# Patient Record
Sex: Male | Born: 1990 | Race: Black or African American | Hispanic: No | Marital: Single | Smoking: Current every day smoker
Health system: Southern US, Community
[De-identification: ages and names within clinical notes are randomized; demographics above are authoritative.]

---
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
## 2011-12-08 ENCOUNTER — Emergency Department: Payer: Self-pay | Admitting: Emergency Medicine

## 2013-04-06 ENCOUNTER — Emergency Department: Payer: Self-pay | Admitting: Emergency Medicine

## 2013-04-08 ENCOUNTER — Emergency Department: Payer: Self-pay | Admitting: Emergency Medicine

## 2013-11-09 ENCOUNTER — Emergency Department: Payer: Self-pay | Admitting: Internal Medicine

## 2013-12-02 ENCOUNTER — Emergency Department: Payer: Self-pay | Admitting: Emergency Medicine

## 2014-01-10 ENCOUNTER — Emergency Department: Payer: Self-pay | Admitting: Internal Medicine

## 2014-08-19 ENCOUNTER — Emergency Department: Payer: Self-pay | Admitting: Emergency Medicine

## 2014-08-22 LAB — BETA STREP CULTURE(ARMC)

## 2015-06-10 ENCOUNTER — Emergency Department
Admission: EM | Admit: 2015-06-10 | Discharge: 2015-06-10 | Disposition: A | Discharge status: Home / Self Care | Payer: Self-pay | Attending: Emergency Medicine | Admitting: Emergency Medicine

## 2015-06-10 ENCOUNTER — Encounter: Payer: Self-pay | Admitting: Emergency Medicine

## 2015-06-10 ENCOUNTER — Emergency Department: Payer: Self-pay

## 2015-06-10 DIAGNOSIS — F17200 Nicotine dependence, unspecified, uncomplicated: Secondary | ICD-10-CM | POA: Insufficient documentation

## 2015-06-10 DIAGNOSIS — Y998 Other external cause status: Secondary | ICD-10-CM | POA: Insufficient documentation

## 2015-06-10 DIAGNOSIS — Y9361 Activity, american tackle football: Secondary | ICD-10-CM | POA: Insufficient documentation

## 2015-06-10 DIAGNOSIS — Y92321 Football field as the place of occurrence of the external cause: Secondary | ICD-10-CM | POA: Insufficient documentation

## 2015-06-10 DIAGNOSIS — W03XXXA Other fall on same level due to collision with another person, initial encounter: Secondary | ICD-10-CM | POA: Insufficient documentation

## 2015-06-10 DIAGNOSIS — S40011A Contusion of right shoulder, initial encounter: Secondary | ICD-10-CM | POA: Insufficient documentation

## 2015-06-10 MED ORDER — IBUPROFEN 800 MG PO TABS: 800.0000 mg | ORAL_TABLET | Freq: Three times a day (TID) | ORAL | Status: DC

## 2015-06-10 NOTE — ED Notes (Signed)
Pt to ed with c/o right shoulder pain after playing football on Tuesday.

## 2015-06-10 NOTE — ED Notes (Signed)
States he was playing football on Tuesday. Was tackled and landed on right shoulder  . conts to have pain  No deformity noted

## 2015-06-10 NOTE — ED Provider Notes (Signed)
[facility] Emergency Department Provider Note  ____________________________________________  Time seen: Approximately 9:06 AM  I have reviewed the triage vital signs and the nursing notes.   HISTORY  Chief Complaint Shoulder Pain   HPI Antonio GottronDerrick M Ortiz is a 24 y.o. male is here complaining of right shoulder pain. Patient states he was playing football 2 days ago when he injured his shoulder. He denies any previous problems with his shoulder. Shoulder is more stiff in the mornings and improves a little during the day. He has not taken any medication today but took Tylenol last evening with minimal relief. Range of motion increases his pain. Currently he rates his pain is 7 out of 10. He denies any head injury or loss of consciousness during this event.   History reviewed. No pertinent past medical history.  There are no active problems to display for this patient.   History reviewed. No pertinent past surgical history.  Current Outpatient Rx  Name  Route  Sig  Dispense  Refill  . ibuprofen (ADVIL,MOTRIN) 800 MG tablet   Oral   Take 1 tablet (800 mg total) by mouth 3 (three) times daily.   30 tablet   0     Allergies Review of patient's allergies indicates no known allergies.  History reviewed. No pertinent family history.  Social History Social History  Substance Use Topics  . Smoking status: Current Every Day Smoker  . Smokeless tobacco: None  . Alcohol Use: Yes    Review of Systems Constitutional: No fever/chills Eyes: No visual changes. ENT: No sore throat. Cardiovascular: Denies chest pain. Respiratory: Denies shortness of breath. Gastrointestinal:  No nausea, no vomiting.   Musculoskeletal: Negative for back pain. Positive right shoulder pain Skin: Negative for rash. Neurological: Negative for headaches, focal weakness or numbness.  10-point ROS otherwise negative.  ____________________________________________   PHYSICAL  EXAM:  VITAL SIGNS: ED Triage Vitals  Enc Vitals Group     BP 06/10/15 0838 128/83 mmHg     Pulse Rate 06/10/15 0838 61     Resp 06/10/15 0838 20     Temp 06/10/15 0838 98.2 F (36.8 C)     Temp Source 06/10/15 0838 Oral     SpO2 06/10/15 0838 97 %     Weight 06/10/15 0838 186 lb (84.369 kg)     Height 06/10/15 0838 5\' 6"  (1.676 m)     Head Cir --      Peak Flow --      Pain Score 06/10/15 0839 7     Pain Loc --      Pain Edu? --      Excl. in GC? --     Constitutional: Alert and oriented. Well appearing and in no acute distress. Eyes: Conjunctivae are normal. PERRL. EOMI. Head: Atraumatic. Nose: No congestion/rhinnorhea. Neck: No stridor.   Cardiovascular: Normal rate, regular rhythm. Grossly normal heart sounds.  Good peripheral circulation. Respiratory: Normal respiratory effort.  No retractions. Lungs CTAB. Gastrointestinal: Soft and nontender. No distention.  Musculoskeletal: No gross deformity noted on examination of the right shoulder. Range of motion is restricted secondary to pain especially with abduction. No edema or crepitus was noted. No lower extremity tenderness nor edema.  No joint effusions. Neurologic:  Normal speech and language. No gross focal neurologic deficits are appreciated. No gait instability. Skin:  Skin is warm, dry and intact. No rash noted. No abrasions, ecchymosis or erythema was noted on the right shoulder. Psychiatric: Mood and affect are normal. Speech  and behavior are normal.  ____________________________________________   LABS (all labs ordered are listed, but only abnormal results are displayed)  Labs Reviewed - No data to display RADIOLOGY  Right shoulder x-ray failed to show any acute fracture or dislocation per radiologist I, Tommi Rumps, personally viewed and evaluated these images (plain radiographs) as part of my medical decision making.  ____________________________________________   PROCEDURES  Procedure(s)  performed: None  Critical Care performed: No  ____________________________________________   INITIAL IMPRESSION / ASSESSMENT AND PLAN / ED COURSE  Pertinent labs & imaging results that were available during my care of the patient were reviewed by me and considered in my medical decision making (see chart for details).  Patient was placed in a arm sling and given a prescription for ibuprofen 800 mg 3 times a day. He is to use ice and elevate as needed. He may follow-up with Dr. Martha Clan if any continued problems. ____________________________________________   FINAL CLINICAL IMPRESSION(S) / ED DIAGNOSES  Final diagnoses:  Shoulder contusion, right, initial encounter      Tommi Rumps, PA-C 06/10/15 1402  Arnaldo Natal, MD 06/10/15 [phone]

## 2015-06-10 NOTE — Discharge Instructions (Signed)
Contusion A contusion is a deep bruise. Contusions happen when an injury causes bleeding under the skin. Symptoms of bruising include pain, swelling, and discolored skin. The skin may turn blue, purple, or yellow. HOME CARE   Rest the injured area.  If told, put ice on the injured area.  Put ice in a plastic bag.  Place a towel between your skin and the bag.  Leave the ice on for 20 minutes, 2-3 times per day.  If told, put light pressure (compression) on the injured area using an elastic bandage. Make sure the bandage is not too tight. Remove it and put it back on as told by your doctor.  If possible, raise (elevate) the injured area above the level of your heart while you are sitting or lying down.  Take over-the-counter and prescription medicines only as told by your doctor. GET HELP IF:  Your symptoms do not get better after several days of treatment.  Your symptoms get worse.  You have trouble moving the injured area. GET HELP RIGHT AWAY IF:   You have very bad pain.  You have a loss of feeling (numbness) in a hand or foot.  Your hand or foot turns pale or cold.   This information is not intended to replace advice given to you by your health care provider. Make sure you discuss any questions you have with your health care provider.   Document Released: 01/17/2008 Document Revised: 04/21/2015 Document Reviewed: 12/16/2014 Elsevier Interactive Patient Education 2016 Elsevier Inc.  Cryotherapy Cryotherapy is when you put ice on your injury. Ice helps lessen pain and puffiness (swelling) after an injury. Ice works the best when you start using it in the first 24 to 48 hours after an injury. HOME CARE  Put a dry or damp towel between the ice pack and your skin.  You may press gently on the ice pack.  Leave the ice on for no more than 10 to 20 minutes at a time.  Check your skin after 5 minutes to make sure your skin is okay.  Rest at least 20 minutes between ice  pack uses.  Stop using ice when your skin loses feeling (numbness).  Do not use ice on someone who cannot tell you when it hurts. This includes small children and people with memory problems (dementia). GET HELP RIGHT AWAY IF:  You have white spots on your skin.  Your skin turns blue or pale.  Your skin feels waxy or hard.  Your puffiness gets worse. MAKE SURE YOU:   Understand these instructions.  Will watch your condition.  Will get help right away if you are not doing well or get worse.   This information is not intended to replace advice given to you by your health care provider. Make sure you discuss any questions you have with your health care provider.   Document Released: 01/17/2008 Document Revised: 10/23/2011 Document Reviewed: 03/23/2011 Elsevier Interactive Patient Education 2016 [employer]    [facility] with your doctor or [facility] any continued problems. Wear sling as needed for support. Use ice frequently today. 8 ibuprofen every 8 hours as needed for pain and inflammation.

## 2015-10-22 ENCOUNTER — Emergency Department
Admission: EM | Admit: 2015-10-22 | Discharge: 2015-10-22 | Disposition: A | Discharge status: Home / Self Care | Payer: Self-pay | Attending: Emergency Medicine | Admitting: Emergency Medicine

## 2015-10-22 ENCOUNTER — Encounter: Payer: Self-pay | Admitting: Medical Oncology

## 2015-10-22 ENCOUNTER — Emergency Department: Payer: Self-pay

## 2015-10-22 DIAGNOSIS — S60222A Contusion of left hand, initial encounter: Secondary | ICD-10-CM

## 2015-10-22 DIAGNOSIS — Y999 Unspecified external cause status: Secondary | ICD-10-CM | POA: Insufficient documentation

## 2015-10-22 DIAGNOSIS — F17210 Nicotine dependence, cigarettes, uncomplicated: Secondary | ICD-10-CM | POA: Insufficient documentation

## 2015-10-22 DIAGNOSIS — W51XXXA Accidental striking against or bumped into by another person, initial encounter: Secondary | ICD-10-CM | POA: Insufficient documentation

## 2015-10-22 DIAGNOSIS — S63502A Unspecified sprain of left wrist, initial encounter: Secondary | ICD-10-CM | POA: Insufficient documentation

## 2015-10-22 DIAGNOSIS — Y9361 Activity, american tackle football: Secondary | ICD-10-CM | POA: Insufficient documentation

## 2015-10-22 DIAGNOSIS — Y929 Unspecified place or not applicable: Secondary | ICD-10-CM | POA: Insufficient documentation

## 2015-10-22 MED ORDER — MELOXICAM 15 MG PO TABS: 15.0000 mg | ORAL_TABLET | Freq: Every day | ORAL | Status: DC

## 2015-10-22 NOTE — ED Notes (Signed)
Pt reports he was playing foot ball yesterday and injured his left hand.

## 2015-10-22 NOTE — ED Provider Notes (Signed)
[facility] Emergency Department Provider Note  ____________________________________________  Time seen: Approximately 3:50 PM  I have reviewed the triage vital signs and the nursing notes.   HISTORY  Chief Complaint Hand Injury    HPI Antonio Ortiz is a 25 y.o. male who injured his left hand playing football yesterday. Patient states that he went to tackle somebody and landed with his hand underneath the other person. He states that he is having pain to the lateral aspect of his hand. He does endorse a previous fracture to the fifth metacarpal bone but states that it has been "a really long time ago." Denies any numbness or tingling in his distal digits. He denies any other injury or complaint at this time. Pain is constant, moderate to severe, sharp in nature.   History reviewed. No pertinent past medical history.  There are no active problems to display for this patient.   History reviewed. No pertinent past surgical history.  Current Outpatient Rx  Name  Route  Sig  Dispense  Refill  . ibuprofen (ADVIL,MOTRIN) 800 MG tablet   Oral   Take 1 tablet (800 mg total) by mouth 3 (three) times daily.   30 tablet   0   . meloxicam (MOBIC) 15 MG tablet   Oral   Take 1 tablet (15 mg total) by mouth daily.   30 tablet   0     Allergies Review of patient's allergies indicates no known allergies.  No family history on file.  Social History Social History  Substance Use Topics  . Smoking status: Current Every Day Smoker  . Smokeless tobacco: None  . Alcohol Use: Yes     Review of Systems  Constitutional: No fever/chills Musculoskeletal: Negative for back pain.Left hand pain.  Skin: Negative for rash. Neurological: Negative for headaches, focal weakness or numbness. 10-point ROS otherwise negative.  ____________________________________________   PHYSICAL EXAM:  VITAL SIGNS: ED Triage Vitals  Enc Vitals Group     BP 10/22/15 1437  137/71 mmHg     Pulse Rate 10/22/15 1437 74     Resp 10/22/15 1437 16     Temp 10/22/15 1437 97.9 F (36.6 C)     Temp Source 10/22/15 1437 Oral     SpO2 10/22/15 1437 98 %     Weight 10/22/15 1437 190 lb (86.183 kg)     Height 10/22/15 1437 5\' 6"  (1.676 m)     Head Cir --      Peak Flow --      Pain Score 10/22/15 1437 7     Pain Loc --      Pain Edu? --      Excl. in GC? --      Constitutional: Alert and oriented. Well appearing and in no acute distress. Eyes: Conjunctivae are normal. PERRL. EOMI. Head: Atraumatic. Cardiovascular: Normal rate, regular rhythm. Normal S1 and S2.  Good peripheral circulation. Respiratory: Normal respiratory effort without tachypnea or retractions. Lungs CTAB. [place]: Minor edema noted to lateral left hand. FROM to wrist and all digits. TTP over 5th metacarpal. No palpable abnormality. Cap refill and sensation intact distally to injury site Neurologic:  Normal speech and language. No gross focal neurologic deficits are appreciated.  Skin:  Skin is warm, dry and intact. No rash noted. Psychiatric: Mood and affect are normal. Speech and behavior are normal. Patient exhibits appropriate insight and judgement.   ____________________________________________   LABS (all labs ordered are listed, but only abnormal results are displayed)  Labs Reviewed - No data to display ____________________________________________  EKG   ____________________________________________  RADIOLOGY Festus Barren Cuthriell, personally viewed and evaluated these images (plain radiographs) as part of my medical decision making, as well as reviewing the written report by the radiologist.  Dg Hand Complete Left  10/22/2015  CLINICAL DATA:  Pt playing football yesterday and hurt his left hand, complains of pain 5th metatarsal EXAM: LEFT HAND - COMPLETE 3+ VIEW COMPARISON:  01/10/2014 FINDINGS: Mild deformity fifth metacarpal related to prior midshaft fracture which  is no longer visible. No other abnormalities. IMPRESSION: Healed fifth metacarpal fracture.  No acute findings. Electronically Signed   By: Esperanza Heir M.D.   On: 10/22/2015 15:25    ____________________________________________    PROCEDURES  Procedure(s) performed:       Medications - No data to display   ____________________________________________   INITIAL IMPRESSION / ASSESSMENT AND PLAN / ED COURSE  Pertinent labs & imaging results that were available during my care of the patient were reviewed by me and considered in my medical decision making (see chart for details).  Patient's diagnosis is consistent with Left hand contusion and wrist sprain.  Patient will be sent home with prescriptions for  anti-inflammatories. Patient is to follow up with orthopedics if symptoms persist past this treatment course. Patient is given ED precautions to return to the ED for any worsening or new symptoms.     ____________________________________________  FINAL CLINICAL IMPRESSION(S) / ED DIAGNOSES  Final diagnoses:  Hand contusion, left, initial encounter  Left wrist sprain, initial encounter      NEW MEDICATIONS STARTED DURING THIS VISIT:  New Prescriptions   MELOXICAM (MOBIC) 15 MG TABLET    Take 1 tablet (15 mg total) by mouth daily.        This chart was dictated using voice recognition software/Dragon. Despite best efforts to proofread, errors can occur which can change the meaning. Any change was purely unintentional.    Racheal Patches, PA-C 10/22/15 1610  Phineas Semen, MD 10/22/15 [phone]

## 2015-10-22 NOTE — Discharge Instructions (Signed)
Hand Contusion A hand contusion is a deep bruise on your hand area. Contusions are the result of an injury that caused bleeding under the skin. The contusion may turn blue, purple, or yellow. Minor injuries will give you a painless contusion, but more severe contusions may stay painful and swollen for a few weeks. CAUSES  A contusion is usually caused by a blow, trauma, or direct force to an area of the body. SYMPTOMS   Swelling and redness of the injured area.  Discoloration of the injured area.  Tenderness and soreness of the injured area.  Pain. DIAGNOSIS  The diagnosis can be made by taking a history and performing a physical exam. An X-ray, CT scan, or MRI may be needed to determine if there were any associated injuries, such as broken bones (fractures). TREATMENT  Often, the best treatment for a hand contusion is resting, elevating, icing, and applying cold compresses to the injured area. Over-the-counter medicines may also be recommended for pain control. HOME CARE INSTRUCTIONS   Put ice on the injured area.  Put ice in a plastic bag.  Place a towel between your skin and the bag.  Leave the ice on for 15-20 minutes, 03-04 times a day.  Only take over-the-counter or prescription medicines as directed by your caregiver. Your caregiver may recommend avoiding anti-inflammatory medicines (aspirin, ibuprofen, and naproxen) for 48 hours because these medicines may increase bruising.  If told, use an elastic wrap as directed. This can help reduce swelling. You may remove the wrap for sleeping, showering, and bathing. If your fingers become numb, cold, or blue, take the wrap off and reapply it more loosely.  Elevate your hand with pillows to reduce swelling.  Avoid overusing your hand if it is painful. SEEK IMMEDIATE MEDICAL CARE IF:   You have increased redness, swelling, or pain in your hand.  Your swelling or pain is not relieved with medicines.  You have loss of feeling in  your hand or are unable to move your fingers.  Your hand turns cold or blue.  You have pain when you move your fingers.  Your hand becomes warm to the touch.  Your contusion does not improve in 2 days. MAKE SURE YOU:   Understand these instructions.  Will watch your condition.  Will get help right away if you are not doing well or get worse.   This information is not intended to replace advice given to you by your health care provider. Make sure you discuss any questions you have with your health care provider.   Document Released: 01/20/2002 Document Revised: 04/24/2012 Document Reviewed: 01/22/2012 Elsevier Interactive Patient Education 2016 Elsevier Inc.  Wrist Sprain A wrist sprain is a stretch or tear in the strong, fibrous tissues (ligaments) that connect your wrist bones. The ligaments of your wrist may be easily sprained. There are three types of wrist sprains.  Grade 1. The ligament is not stretched or torn, but the sprain causes pain.  Grade 2. The ligament is stretched or partially torn. You may be able to move your wrist, but not very much.  Grade 3. The ligament or muscle completely tears. You may find it difficult or extremely painful to move your wrist even a little. CAUSES Often, wrist sprains are a result of a fall or an injury. The force of the impact causes the fibers of your ligament to stretch too much or tear. Common causes of wrist sprains include:  Overextending your wrist while catching a ball with your  hands.  Repetitive or strenuous extension or bending of your wrist.  Landing on your hand during a fall. RISK FACTORS  Having previous wrist injuries.  Playing contact sports, such as boxing or wrestling.  Participating in activities in which falling is common.  Having poor wrist strength and flexibility. SIGNS AND SYMPTOMS  Wrist pain.  Wrist tenderness.  Inflammation or bruising of the wrist area.  Hearing a "pop" or feeling a tear at  the time of the injury.  Decreased wrist movement due to pain, stiffness, or weakness. DIAGNOSIS Your health care provider will examine your wrist. In some cases, an X-ray will be taken to make sure you did not break any bones. If your health care provider thinks that you tore a ligament, he or she may order an MRI of your wrist. TREATMENT Treatment involves resting and icing your wrist. You may also need to take pain medicines to help lessen pain and inflammation. Your health care provider may recommend keeping your wrist still (immobilized) with a splint to help your sprain heal. When the splint is no longer necessary, you may need to perform strengthening and stretching exercises. These exercises help you to regain strength and full range of motion in your wrist. Surgery is not usually needed for wrist sprains unless the ligament completely tears. HOME CARE INSTRUCTIONS  Rest your wrist. Do not do things that cause pain.  Wear your wrist splint as directed by your health care provider.  Take medicines only as directed by your health care provider.  To ease pain and swelling, apply ice to the injured area.  Put ice in a plastic bag.  Place a towel between your skin and the bag.  Leave the ice on for 20 minutes, 2-3 times a day. SEEK MEDICAL CARE IF:  Your pain, discomfort, or swelling gets worse even with treatment.  You feel sudden numbness in your hand.   This information is not intended to replace advice given to you by your health care provider. Make sure you discuss any questions you have with your health care provider.   Document Released: 04/03/2014 Document Reviewed: 04/03/2014 Elsevier Interactive Patient Education [employer] Elsevier Inc.

## 2016-05-13 ENCOUNTER — Emergency Department: Payer: Self-pay

## 2016-05-13 ENCOUNTER — Encounter: Payer: Self-pay | Admitting: Emergency Medicine

## 2016-05-13 ENCOUNTER — Emergency Department
Admission: EM | Admit: 2016-05-13 | Discharge: 2016-05-13 | Disposition: A | Discharge status: Home / Self Care | Payer: Self-pay | Attending: Student in an Organized Health Care Education/Training Program | Admitting: Student in an Organized Health Care Education/Training Program

## 2016-05-13 DIAGNOSIS — Y9389 Activity, other specified: Secondary | ICD-10-CM | POA: Insufficient documentation

## 2016-05-13 DIAGNOSIS — F17210 Nicotine dependence, cigarettes, uncomplicated: Secondary | ICD-10-CM | POA: Insufficient documentation

## 2016-05-13 DIAGNOSIS — Y990 Civilian activity done for income or pay: Secondary | ICD-10-CM | POA: Insufficient documentation

## 2016-05-13 DIAGNOSIS — S233XXA Sprain of ligaments of thoracic spine, initial encounter: Secondary | ICD-10-CM | POA: Insufficient documentation

## 2016-05-13 DIAGNOSIS — Y929 Unspecified place or not applicable: Secondary | ICD-10-CM | POA: Insufficient documentation

## 2016-05-13 DIAGNOSIS — S239XXA Sprain of unspecified parts of thorax, initial encounter: Secondary | ICD-10-CM

## 2016-05-13 DIAGNOSIS — R0789 Other chest pain: Secondary | ICD-10-CM | POA: Insufficient documentation

## 2016-05-13 DIAGNOSIS — X500XXA Overexertion from strenuous movement or load, initial encounter: Secondary | ICD-10-CM | POA: Insufficient documentation

## 2016-05-13 DIAGNOSIS — Y92009 Unspecified place in unspecified non-institutional (private) residence as the place of occurrence of the external cause: Secondary | ICD-10-CM | POA: Insufficient documentation

## 2016-05-13 MED ORDER — CYCLOBENZAPRINE HCL 10 MG PO TABS
5.0000 mg | ORAL_TABLET | Freq: Once | ORAL | Status: AC
  Administered 2016-05-13: 5 mg via ORAL
  Filled 2016-05-13: qty 1

## 2016-05-13 MED ORDER — DEXAMETHASONE SODIUM PHOSPHATE 10 MG/ML IJ SOLN
10.0000 mg | Freq: Once | INTRAMUSCULAR | Status: AC
  Administered 2016-05-13: 10 mg via INTRAMUSCULAR
  Filled 2016-05-13: qty 1

## 2016-05-13 MED ORDER — PREDNISONE 10 MG PO TABS: 10.0000 mg | ORAL_TABLET | Freq: Two times a day (BID) | ORAL | 0 refills | Status: DC

## 2016-05-13 MED ORDER — CYCLOBENZAPRINE HCL 5 MG PO TABS: 5.0000 mg | ORAL_TABLET | Freq: Three times a day (TID) | ORAL | 0 refills | Status: DC | PRN

## 2016-05-13 NOTE — ED Notes (Signed)
States he developed pain to mid chest 2 days ago.  Pain increased with inspiration. denies any fever cough or n/v .

## 2016-05-13 NOTE — ED Triage Notes (Signed)
Pt arrived via POV from home with c/o chest wall pain on inspiration. Pt states he was unable to sleep last night due to difficulty breathing. Pt also states he has been having back pain for the past 3 days worse on the left side. Pt states he does do some heavy lifting at work and pain feels better upon palpation.

## 2016-05-13 NOTE — ED Provider Notes (Signed)
[facility] Emergency Department Provider Note ____________________________________________  Time seen: 12  I have reviewed the triage vital signs and the nursing notes.  HISTORY  Chief Complaint  Back Pain and Chest Pain (Chest Wall Pain)  HPI Antonio Ortiz is a 25 y.o. male presents to the ED from home via personal vehicle with complaints of anterior left chest wall pain on inspiration. He reports the onset of the pain at rest. He describes pain as been significant enough to impede his sleep last night. He also describes a 3 day complaint of upper back pain on the left side. He does not report a connection of the back and chest wall pain. He can reproduce the anterior chest wall pain by touching his left pectoralis muscle. Patient is a current every day smoker denies any injury, accident, trauma, fall. He also denies any dyspnea on exertion, diaphoresis, nausea, vomiting, or referral of pain.  History reviewed. No pertinent past medical history.  There are no active problems to display for this patient.  History reviewed. No pertinent surgical history.  Prior to Admission medications   Medication Sig Start Date End Date Taking? Authorizing Provider  cyclobenzaprine (FLEXERIL) 5 MG tablet Take 1 tablet (5 mg total) by mouth 3 (three) times daily as needed for muscle spasms. 05/13/16   Eiden Bagot V Bacon Rendy Lazard, PA-C  predniSONE (DELTASONE) 10 MG tablet Take 1 tablet (10 mg total) by mouth 2 (two) times daily with a meal. 05/13/16   Cartha Rotert V Bacon Raul Torrance, PA-C    Allergies Review of patient's allergies indicates no known allergies.  History reviewed. No pertinent family history.  Social History Social History  Substance Use Topics  . Smoking status: Current Every Day Smoker    Packs/day: 0.50    Types: Cigarettes  . Smokeless tobacco: Never Used  . Alcohol use Yes   Review of Systems  Constitutional: Negative for fever. Cardiovascular: Negative for  chest pain. Respiratory: Negative for shortness of breath. Gastrointestinal: Negative for abdominal pain, vomiting and diarrhea. Genitourinary: Negative for dysuria. Musculoskeletal: Positive for chest wall and back pain. Skin: Negative for rash. Neurological: Negative for headaches, focal weakness or numbness. ____________________________________________  PHYSICAL EXAM:  VITAL SIGNS: ED Triage Vitals  Enc Vitals Group     BP 05/13/16 1041 (!) 144/88     Pulse Rate 05/13/16 1041 68     Resp 05/13/16 1041 18     Temp 05/13/16 1041 98.2 F (36.8 C)     Temp src --      SpO2 05/13/16 1041 99 %     Weight 05/13/16 1035 203 lb (92.1 kg)     Height 05/13/16 1035 5' 6.5" (1.689 m)     Head Circumference --      Peak Flow --      Pain Score 05/13/16 1036 9     Pain Loc --      Pain Edu? --      Excl. in GC? --     Constitutional: Alert and oriented. Well appearing and in no distress. Head: Normocephalic and atraumatic.      Eyes: Conjunctivae are normal. PERRL. Normal extraocular movements      Ears: Canals clear. TMs intact bilaterally.   Nose: No congestion/rhinorrhea.   Mouth/Throat: Mucous membranes are moist.   Neck: Supple. No thyromegaly. Cardiovascular: Normal rate, regular rhythm.  Respiratory: Normal respiratory effort. No wheezes/rales/rhonchi. Gastrointestinal: Soft and nontender. No distention. Musculoskeletal: Nontender with normal range of motion in all extremities.  Neurologic:  Normal gait without ataxia. Normal speech and language. No gross focal neurologic deficits are appreciated. Skin:  Skin is warm, dry and intact. No rash noted. Psychiatric: Mood and affect are normal. Patient exhibits appropriate insight and judgment. ____________________________________________  EKG  NSR 67 bpm Normal axis No STEMI ____________________________________________   RADIOLOGY  CXR IMPRESSION: No active cardiopulmonary  disease. ____________________________________________  PROCEDURES  Decadron 10 mg IM Flexeril 5 mg PO ____________________________________________  INITIAL IMPRESSION / ASSESSMENT AND PLAN / ED COURSE  Patient with musculoskeletal anterior chest wall pain and left-sided scapular thoracic muscle pain. His exam is reassuring as his pain is reproducible with palpation. He also has a negative chest x-ray and normal EKG at this time. Patient is discharged with a prescription for prednisone and Flexeril dose as directed. He will follow verbal commands advised the management. Return precautions are reviewed. A work note is provided as requested for 3 days.  Clinical Course   ____________________________________________  FINAL CLINICAL IMPRESSION(S) / ED DIAGNOSES  Final diagnoses:  Chest wall pain  Thoracic back sprain, initial encounter      Lissa HoardJenise V Bacon Daymien Goth, PA-C 05/13/16 1209    Willy EddyPatrick Robinson, MD 05/13/16 [id number]

## 2016-05-13 NOTE — Discharge Instructions (Signed)
Your EKG, chest x-ray and exam are normal. There is no indication of infection, pneumonia, or bronchitis. You are being treated for musculoskeletal pain likely due to inflammation. Take the prescription meds as directed. Apply moist heat to reduce symptoms. [facility] for continued symptoms.

## 2016-09-02 ENCOUNTER — Emergency Department
Admission: EM | Admit: 2016-09-02 | Discharge: 2016-09-02 | Disposition: A | Discharge status: Home / Self Care | Payer: Self-pay | Attending: Emergency Medicine | Admitting: Emergency Medicine

## 2016-09-02 ENCOUNTER — Encounter: Payer: Self-pay | Admitting: Emergency Medicine

## 2016-09-02 DIAGNOSIS — Y999 Unspecified external cause status: Secondary | ICD-10-CM | POA: Insufficient documentation

## 2016-09-02 DIAGNOSIS — Y9389 Activity, other specified: Secondary | ICD-10-CM | POA: Insufficient documentation

## 2016-09-02 DIAGNOSIS — X501XXA Overexertion from prolonged static or awkward postures, initial encounter: Secondary | ICD-10-CM | POA: Insufficient documentation

## 2016-09-02 DIAGNOSIS — Y929 Unspecified place or not applicable: Secondary | ICD-10-CM | POA: Insufficient documentation

## 2016-09-02 DIAGNOSIS — S161XXA Strain of muscle, fascia and tendon at neck level, initial encounter: Secondary | ICD-10-CM | POA: Insufficient documentation

## 2016-09-02 DIAGNOSIS — Z79899 Other long term (current) drug therapy: Secondary | ICD-10-CM | POA: Insufficient documentation

## 2016-09-02 DIAGNOSIS — F17210 Nicotine dependence, cigarettes, uncomplicated: Secondary | ICD-10-CM | POA: Insufficient documentation

## 2016-09-02 DIAGNOSIS — R300 Dysuria: Secondary | ICD-10-CM | POA: Insufficient documentation

## 2016-09-02 LAB — URINALYSIS, ROUTINE W REFLEX MICROSCOPIC
BILIRUBIN URINE: NEGATIVE
Glucose, UA: NEGATIVE mg/dL
HGB URINE DIPSTICK: NEGATIVE
Ketones, ur: NEGATIVE mg/dL
Leukocytes, UA: NEGATIVE
Nitrite: NEGATIVE
Protein, ur: NEGATIVE mg/dL
SPECIFIC GRAVITY, URINE: 1.028 (ref 1.005–1.030)
pH: 5 (ref 5.0–8.0)

## 2016-09-02 LAB — CHLAMYDIA/NGC RT PCR (ARMC ONLY)
Chlamydia Tr: NOT DETECTED
N gonorrhoeae: NOT DETECTED

## 2016-09-02 MED ORDER — IBUPROFEN 800 MG PO TABS: 800.0000 mg | ORAL_TABLET | Freq: Three times a day (TID) | ORAL | 0 refills | Status: DC

## 2016-09-02 MED ORDER — IBUPROFEN 800 MG PO TABS
800.0000 mg | ORAL_TABLET | Freq: Once | ORAL | Status: AC
  Administered 2016-09-02: 800 mg via ORAL
  Filled 2016-09-02: qty 1

## 2016-09-02 MED ORDER — METHOCARBAMOL 500 MG PO TABS: 500.0000 mg | ORAL_TABLET | Freq: Four times a day (QID) | ORAL | 0 refills | Status: DC

## 2016-09-02 NOTE — ED Provider Notes (Signed)
[facility] Emergency Department Provider Note  ____________________________________________   First MD Initiated Contact with Patient 09/02/16 1044     (approximate)  I have reviewed the triage vital signs and the nursing notes.   HISTORY  Chief Complaint Torticollis and Dysuria    HPI Antonio Ortiz is a 26 y.o. male is here with complaint of neck pain.Patient denies any history of injury. He states that he overused his neck at work and states that he needs a work note. He states that this morning he continues to have "stiffness". He also does not want to file a workman's comp. While he was here he mentions that he has had some urinary difficulty. Past year. Patient states that for the last 3 months he has had some itching, hesitancy, burning sensation and occasionally some discharge. He denies any fever or chills. There's been no nausea or vomiting. He has not been seen for this prior to today's visit. He denies any prior urinary tract infections.   History reviewed. No pertinent past medical history.  There are no active problems to display for this patient.   History reviewed. No pertinent surgical history.  Prior to Admission medications   Medication Sig Start Date End Date Taking? Authorizing Provider  cyclobenzaprine (FLEXERIL) 5 MG tablet Take 1 tablet (5 mg total) by mouth 3 (three) times daily as needed for muscle spasms. 05/13/16   Jenise V Bacon Menshew, PA-C  ibuprofen (ADVIL,MOTRIN) 800 MG tablet Take 1 tablet (800 mg total) by mouth 3 (three) times daily. 09/02/16   Tommi Rumpshonda L Summers, PA-C  methocarbamol (ROBAXIN) 500 MG tablet Take 1 tablet (500 mg total) by mouth 4 (four) times daily. 09/02/16   Tommi Rumpshonda L Summers, PA-C  predniSONE (DELTASONE) 10 MG tablet Take 1 tablet (10 mg total) by mouth 2 (two) times daily with a meal. 05/13/16   Jenise V Bacon Menshew, PA-C    Allergies Patient has no known allergies.  No family history on  file.  Social History Social History  Substance Use Topics  . Smoking status: Current Every Day Smoker    Packs/day: 0.50    Types: Cigarettes  . Smokeless tobacco: Never Used  . Alcohol use Yes    Review of Systems Constitutional: No fever/chills Eyes: No visual changes. ENT: No sore throat. Denies ear pain. Cardiovascular: Denies chest pain. Respiratory: Denies shortness of breath. Gastrointestinal: No abdominal pain.  No nausea, no vomiting.   Genitourinary: Positive for chronic dysuria. Musculoskeletal: Negative for back pain. Skin: Negative for rash. Neurological: Negative for headaches, focal weakness or numbness.  10-point ROS otherwise negative.  ____________________________________________   PHYSICAL EXAM:  VITAL SIGNS: ED Triage Vitals  Enc Vitals Group     BP 09/02/16 0941 134/66     Pulse Rate 09/02/16 0941 66     Resp 09/02/16 0941 18     Temp 09/02/16 0941 98.1 F (36.7 C)     Temp src --      SpO2 09/02/16 0941 100 %     Weight 09/02/16 0942 187 lb (84.8 kg)     Height 09/02/16 0942 5\' 6"  (1.676 m)     Head Circumference --      Peak Flow --      Pain Score 09/02/16 0943 5     Pain Loc --      Pain Edu? --      Excl. in GC? --     Constitutional: Alert and oriented. Well appearing and in  no acute distress. Eyes: Conjunctivae are normal. PERRL. EOMI. Head: Atraumatic. Nose: No congestion/rhinnorhea. Neck: No stridor.  Minimal tenderness on palpation of cervical spine posteriorly. No gross deformity. Range of motion is slightly restricted in all planes secondary to discomfort. There is tenderness on palpation of the cervical muscles lateral aspect. Hematological/Lymphatic/Immunilogical: No cervical lymphadenopathy. Cardiovascular: Normal rate, regular rhythm. Grossly normal heart sounds.  Good peripheral circulation. Respiratory: Normal respiratory effort.  No retractions. Lungs CTAB. Gastrointestinal: Soft and nontender. No distention.  No CVA  tenderness. Genitourinary: Patient refused a prostate exam. Musculoskeletal: Moves upper and lower extremities benign difficulty. Normal gait was noted. Neurologic:  Normal speech and language. No gross focal neurologic deficits are appreciated. No gait instability. Skin:  Skin is warm, dry and intact. No rash noted. Psychiatric: Mood and affect are normal. Speech and behavior are normal.  ____________________________________________   LABS (all labs ordered are listed, but only abnormal results are displayed)  Labs Reviewed  URINALYSIS, ROUTINE W REFLEX MICROSCOPIC - Abnormal; Notable for the following:       Result Value   Color, Urine YELLOW (*)    APPearance CLEAR (*)    All other components within normal limits  CHLAMYDIA/NGC RT PCR ([facility] ONLY)    PROCEDURES  Procedure(s) performed: None  Procedures  Critical Care performed: No  ____________________________________________   INITIAL IMPRESSION / ASSESSMENT AND PLAN / ED COURSE  Pertinent labs & imaging results that were available during my care of the patient were reviewed by me and considered in my medical decision making (see chart for details).  Patient was made aware that his urine was normal. Patient refused examination of his prostate stating that he would "mentally have to get ready". While in the emergency room patient was given ibuprofen and appeared to be moving his neck much better however when asked patient states that he does not see an improvement. Patient was discharged on ibuprofen and Robaxin 4 times a day. He is encouraged to use ice or heat to his neck for discomfort. He was also given the name of the urologist on call to follow-up for any continued neurological symptoms. Patient was given a note to return to work tomorrow.      ____________________________________________   FINAL CLINICAL IMPRESSION(S) / ED DIAGNOSES  Final diagnoses:  Cervical muscle strain, initial encounter    Dysuria      NEW MEDICATIONS STARTED DURING THIS VISIT:  Discharge Medication List as of 09/02/2016 12:45 PM    START taking these medications   Details  ibuprofen (ADVIL,MOTRIN) 800 MG tablet Take 1 tablet (800 mg total) by mouth 3 (three) times daily., Starting Sat 09/02/2016, Print    methocarbamol (ROBAXIN) 500 MG tablet Take 1 tablet (500 mg total) by mouth 4 (four) times daily., Starting Sat 09/02/2016, Print         Note:  This document was prepared using Dragon voice recognition software and may include unintentional dictation errors.    Tommi Rumps, PA-C 09/02/16 1315    Minna Antis, MD 09/02/16 [phone]

## 2016-09-02 NOTE — ED Notes (Signed)
Pt reports neck pain that started yesterday painful to move in any direction, Pt also reports urinary difficulty for the past year, when patient was asked privately about discharge, pt states he has had some penile discharge.  Pt given cups to collect for dirty and clean specimen.  Pt states when he urinates, he has hesitancy, tingling, burning and an itching sensation.

## 2016-09-02 NOTE — ED Triage Notes (Signed)
Reports neck is stiff from the way he has to hold it at work.  States he needs a work note.  [facility]

## 2016-09-02 NOTE — Discharge Instructions (Signed)
Call and make an appointment with Dr. Jerrel IvoryWollin for further testing of your urine problems.   Take ibuprofen for inflammation and pain, take Robaxin for muscles.  Moist heat or ice to neck muscles.

## 2018-04-30 ENCOUNTER — Encounter: Payer: Self-pay | Admitting: Emergency Medicine

## 2018-04-30 ENCOUNTER — Emergency Department
Admission: EM | Admit: 2018-04-30 | Discharge: 2018-04-30 | Disposition: A | Discharge status: Home / Self Care | Payer: Self-pay | Attending: Emergency Medicine | Admitting: Emergency Medicine

## 2018-04-30 DIAGNOSIS — F17210 Nicotine dependence, cigarettes, uncomplicated: Secondary | ICD-10-CM | POA: Insufficient documentation

## 2018-04-30 DIAGNOSIS — J020 Streptococcal pharyngitis: Secondary | ICD-10-CM | POA: Insufficient documentation

## 2018-04-30 LAB — GROUP A STREP BY PCR: Group A Strep by PCR: DETECTED — AB

## 2018-04-30 MED ORDER — AMOXICILLIN 500 MG PO CAPS: 500.0000 mg | ORAL_CAPSULE | Freq: Three times a day (TID) | ORAL | 0 refills | Status: DC

## 2018-04-30 MED ORDER — LIDOCAINE VISCOUS HCL 2 % MT SOLN
15.0000 mL | Freq: Once | OROMUCOSAL | Status: AC
  Administered 2018-04-30: 15 mL via OROMUCOSAL
  Filled 2018-04-30: qty 15

## 2018-04-30 MED ORDER — DIPHENHYDRAMINE HCL 12.5 MG/5ML PO ELIX
12.5000 mg | ORAL_SOLUTION | Freq: Once | ORAL | Status: AC
  Administered 2018-04-30: 12.5 mg via ORAL
  Filled 2018-04-30: qty 5

## 2018-04-30 MED ORDER — LIDOCAINE VISCOUS HCL 2 % MT SOLN: 5.0000 mL | Freq: Four times a day (QID) | OROMUCOSAL | 0 refills | Status: DC | PRN

## 2018-04-30 NOTE — ED Notes (Signed)
See triage note   Presents with sore throat for the past week   Unsure of fever  But having increased pain with swallowing

## 2018-04-30 NOTE — ED Provider Notes (Signed)
[facility] Emergency Department Provider Note   ____________________________________________   First MD Initiated Contact with Patient 04/30/18 1230     (approximate)  I have reviewed the triage vital signs and the nursing notes.   HISTORY  Chief Complaint Sore Throat    HPI Antonio Ortiz is a 27 y.o. male patient presents with sore throat for 1 week.  Patient denies fever, nasal congestion, cough, or chest congestion.  Patient state hurts to swallow.  Patient rates his pain discomfort a 10/10.  Patient describes pain as "sore".  No palliative measures for complaint.   History reviewed. No pertinent past medical history.  There are no active problems to display for this patient.   History reviewed. No pertinent surgical history.  Prior to Admission medications   Medication Sig Start Date End Date Taking? Authorizing Provider  amoxicillin (AMOXIL) 500 MG capsule Take 1 capsule (500 mg total) by mouth 3 (three) times daily. 04/30/18   Joni ReiningSmith, Aniylah Avans K, PA-C  cyclobenzaprine (FLEXERIL) 5 MG tablet Take 1 tablet (5 mg total) by mouth 3 (three) times daily as needed for muscle spasms. 05/13/16   Menshew, Charlesetta IvoryJenise V Bacon, PA-C  ibuprofen (ADVIL,MOTRIN) 800 MG tablet Take 1 tablet (800 mg total) by mouth 3 (three) times daily. 09/02/16   Tommi RumpsSummers, Rhonda L, PA-C  lidocaine (XYLOCAINE) 2 % solution Use as directed 5 mLs in the mouth or throat every 6 (six) hours as needed for mouth pain. Swish and swallow. 04/30/18   Joni ReiningSmith, Polina Burmaster K, PA-C  methocarbamol (ROBAXIN) 500 MG tablet Take 1 tablet (500 mg total) by mouth 4 (four) times daily. 09/02/16   Tommi RumpsSummers, Rhonda L, PA-C  predniSONE (DELTASONE) 10 MG tablet Take 1 tablet (10 mg total) by mouth 2 (two) times daily with a meal. 05/13/16   Menshew, Charlesetta IvoryJenise V Bacon, PA-C    Allergies Patient has no known allergies.  No family history on file.  Social History Social History   Tobacco Use  . Smoking status: Current  Every Day Smoker    Packs/day: 0.50    Types: Cigarettes  . Smokeless tobacco: Never Used  Substance Use Topics  . Alcohol use: Yes  . Drug use: No    Review of Systems Constitutional: No fever/chills Eyes: No visual changes. ENT: Sore throat.   Cardiovascular: Denies chest pain. Respiratory: Denies shortness of breath. Gastrointestinal: No abdominal pain.  No nausea, no vomiting.  No diarrhea.  No constipation. Genitourinary: Negative for dysuria. Musculoskeletal: Negative for back pain. Skin: Negative for rash. Neurological: Negative for headaches, focal weakness or numbness.   ____________________________________________   PHYSICAL EXAM:  VITAL SIGNS: ED Triage Vitals  Enc Vitals Group     BP 04/30/18 1231 (!) 146/66     Pulse Rate 04/30/18 1231 68     Resp --      Temp 04/30/18 1231 98.3 F (36.8 C)     Temp Source 04/30/18 1231 Oral     SpO2 04/30/18 1231 98 %     Weight 04/30/18 1228 218 lb (98.9 kg)     Height 04/30/18 1228 5\' 6"  (1.676 m)     Head Circumference --      Peak Flow --      Pain Score 04/30/18 1227 10     Pain Loc --      Pain Edu? --      Excl. in GC? --    Constitutional: Alert and oriented. Well appearing and in no acute distress. Mouth/Throat:  Mucous membranes are moist.  Oropharynx erythematous.  Edematous but not exudative tonsils. Neck: No stridor.  Hematological/Lymphatic/Immunilogical: Bilateral cervical lymphadenopathy. Cardiovascular: Normal rate, regular rhythm. Grossly normal heart sounds.  Good peripheral circulation. Respiratory: Normal respiratory effort.  No retractions. Lungs CTAB. Neurologic:  Normal speech and language. No gross focal neurologic deficits are appreciated. No gait instability. Skin:  Skin is warm, dry and intact. No rash noted. Psychiatric: Mood and affect are normal. Speech and behavior are normal.  ____________________________________________   LABS (all labs ordered are listed, but only abnormal  results are displayed)  Labs Reviewed  GROUP A STREP BY PCR - Abnormal; Notable for the following components:      Result Value   Group A Strep by PCR DETECTED (*)    All other components within normal limits   ____________________________________________  EKG   ____________________________________________  RADIOLOGY  ED MD interpretation:    Official radiology report(s): No results found.  ____________________________________________   PROCEDURES  Procedure(s) performed: None  Procedures  Critical Care performed: No  ____________________________________________   INITIAL IMPRESSION / ASSESSMENT AND PLAN / ED COURSE  As part of my medical decision making, I reviewed the following data within the electronic MEDICAL RECORD NUMBER   Patient presents 1 week of sore throat.  Patient strep test was positive.  Patient given discharge care instruction advised take medication as directed.  [facility]     ____________________________________________   FINAL CLINICAL IMPRESSION(S) / ED DIAGNOSES  Final diagnoses:  Strep pharyngitis     ED Discharge Orders         Ordered    lidocaine (XYLOCAINE) 2 % solution  Every 6 hours PRN     04/30/18 1446    amoxicillin (AMOXIL) 500 MG capsule  3 times daily     04/30/18 1446           Note:  This document was prepared using Dragon voice recognition software and may include unintentional dictation errors.    Joni Reining, PA-C 04/30/18 1504    Emily Filbert, MD 04/30/18 [phone]

## 2018-04-30 NOTE — ED Triage Notes (Signed)
Pt reports sore throat and hurts to swallow for the past week. Pt denies fevers, cough or congestion.

## 2019-01-05 ENCOUNTER — Emergency Department
Admission: EM | Admit: 2019-01-05 | Discharge: 2019-01-05 | Disposition: A | Discharge status: Home / Self Care | Payer: Self-pay | Attending: Emergency Medicine | Admitting: Emergency Medicine

## 2019-01-05 ENCOUNTER — Emergency Department: Payer: Self-pay

## 2019-01-05 ENCOUNTER — Other Ambulatory Visit: Payer: Self-pay

## 2019-01-05 DIAGNOSIS — Y999 Unspecified external cause status: Secondary | ICD-10-CM | POA: Insufficient documentation

## 2019-01-05 DIAGNOSIS — S61307A Unspecified open wound of left little finger with damage to nail, initial encounter: Secondary | ICD-10-CM | POA: Insufficient documentation

## 2019-01-05 DIAGNOSIS — Y92410 Unspecified street and highway as the place of occurrence of the external cause: Secondary | ICD-10-CM | POA: Insufficient documentation

## 2019-01-05 DIAGNOSIS — S61309A Unspecified open wound of unspecified finger with damage to nail, initial encounter: Secondary | ICD-10-CM

## 2019-01-05 DIAGNOSIS — S61305A Unspecified open wound of left ring finger with damage to nail, initial encounter: Secondary | ICD-10-CM | POA: Insufficient documentation

## 2019-01-05 DIAGNOSIS — Y9355 Activity, bike riding: Secondary | ICD-10-CM | POA: Insufficient documentation

## 2019-01-05 DIAGNOSIS — Z79899 Other long term (current) drug therapy: Secondary | ICD-10-CM | POA: Insufficient documentation

## 2019-01-05 DIAGNOSIS — F17210 Nicotine dependence, cigarettes, uncomplicated: Secondary | ICD-10-CM | POA: Insufficient documentation

## 2019-01-05 MED ORDER — CEPHALEXIN 500 MG PO CAPS: 500.0000 mg | ORAL_CAPSULE | Freq: Three times a day (TID) | ORAL | 0 refills | Status: AC

## 2019-01-05 MED ORDER — BUPIVACAINE HCL (PF) 0.5 % IJ SOLN
30.0000 mL | Freq: Once | INTRAMUSCULAR | Status: AC
  Administered 2019-01-05: 09:00:00 30 mL
  Filled 2019-01-05: qty 30

## 2019-01-05 MED ORDER — HYDROCODONE-ACETAMINOPHEN 5-325 MG PO TABS: 1.0000 | ORAL_TABLET | ORAL | 0 refills | Status: DC | PRN

## 2019-01-05 MED ORDER — LIDOCAINE HCL (PF) 1 % IJ SOLN
5.0000 mL | Freq: Once | INTRAMUSCULAR | Status: AC
  Administered 2019-01-05: 5 mL via INTRADERMAL
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
## 2019-01-05 NOTE — Discharge Instructions (Signed)
Return to the ER for symptoms of concern.  Take the antibiotic until finished and the pain medication if needed.

## 2019-06-16 ENCOUNTER — Other Ambulatory Visit: Payer: Self-pay

## 2019-06-16 ENCOUNTER — Emergency Department
Admission: EM | Admit: 2019-06-16 | Discharge: 2019-06-16 | Disposition: A | Discharge status: Home / Self Care | Payer: Self-pay | Attending: Emergency Medicine | Admitting: Emergency Medicine

## 2019-06-16 ENCOUNTER — Encounter: Payer: Self-pay | Admitting: Emergency Medicine

## 2019-06-16 DIAGNOSIS — F17210 Nicotine dependence, cigarettes, uncomplicated: Secondary | ICD-10-CM | POA: Insufficient documentation

## 2019-06-16 DIAGNOSIS — J36 Peritonsillar abscess: Secondary | ICD-10-CM | POA: Insufficient documentation

## 2019-06-16 LAB — CBC WITH DIFFERENTIAL/PLATELET
Abs Immature Granulocytes: 0.09 10*3/uL — ABNORMAL HIGH (ref 0.00–0.07)
Basophils Absolute: 0.1 10*3/uL (ref 0.0–0.1)
Basophils Relative: 0 %
Eosinophils Absolute: 0.2 10*3/uL (ref 0.0–0.5)
Eosinophils Relative: 1 %
HCT: 43.6 % (ref 39.0–52.0)
Hemoglobin: 14.3 g/dL (ref 13.0–17.0)
Immature Granulocytes: 1 %
Lymphocytes Relative: 16 %
Lymphs Abs: 2.3 10*3/uL (ref 0.7–4.0)
MCH: 27.6 pg (ref 26.0–34.0)
MCHC: 32.8 g/dL (ref 30.0–36.0)
MCV: 84.2 fL (ref 80.0–100.0)
Monocytes Absolute: 1.3 10*3/uL — ABNORMAL HIGH (ref 0.1–1.0)
Monocytes Relative: 9 %
Neutro Abs: 10.6 10*3/uL — ABNORMAL HIGH (ref 1.7–7.7)
Neutrophils Relative %: 73 %
Platelets: 282 10*3/uL (ref 150–400)
RBC: 5.18 MIL/uL (ref 4.22–5.81)
RDW: 13.4 % (ref 11.5–15.5)
WBC: 14.6 10*3/uL — ABNORMAL HIGH (ref 4.0–10.5)
nRBC: 0 % (ref 0.0–0.2)

## 2019-06-16 LAB — BASIC METABOLIC PANEL
Anion gap: 10 (ref 5–15)
BUN: 9 mg/dL (ref 6–20)
CO2: 27 mmol/L (ref 22–32)
Calcium: 9.4 mg/dL (ref 8.9–10.3)
Chloride: 101 mmol/L (ref 98–111)
Creatinine, Ser: 1.04 mg/dL (ref 0.61–1.24)
GFR calc Af Amer: >60 mL/min (ref >60)
GFR calc non Af Amer: >60 mL/min (ref >60)
Glucose, Bld: 108 mg/dL — ABNORMAL HIGH (ref 70–99)
Potassium: 4.3 mmol/L (ref 3.5–5.1)
Sodium: 138 mmol/L (ref 135–145)

## 2019-06-16 LAB — GROUP A STREP BY PCR: Group A Strep by PCR: DETECTED — AB

## 2019-06-16 MED ORDER — AMOXICILLIN-POT CLAVULANATE 875-125 MG PO TABS: 1.0000 | ORAL_TABLET | Freq: Two times a day (BID) | ORAL | 0 refills | Status: AC

## 2019-06-16 MED ORDER — SODIUM CHLORIDE 0.9 % IV SOLN
3.0000 g | Freq: Once | INTRAVENOUS | Status: AC
  Administered 2019-06-16: 13:00:00 3 g via INTRAVENOUS
  Filled 2019-06-16: qty 8

## 2019-06-16 MED ORDER — HYDROCODONE-ACETAMINOPHEN 5-325 MG PO TABS: 1.0000 | ORAL_TABLET | Freq: Four times a day (QID) | ORAL | 0 refills | Status: DC | PRN

## 2019-06-16 MED ORDER — DEXAMETHASONE SODIUM PHOSPHATE 10 MG/ML IJ SOLN
10.0000 mg | Freq: Once | INTRAMUSCULAR | Status: AC
  Administered 2019-06-16: 13:00:00 10 mg via INTRAVENOUS
  Filled 2019-06-16: qty 1

## 2019-06-16 MED ORDER — PREDNISONE 10 MG PO TABS: ORAL_TABLET | ORAL | 0 refills | Status: DC

## 2019-06-16 NOTE — ED Triage Notes (Signed)
Pt reports sore throat for the past 3 days. Denies fevers or other sx's.

## 2019-06-16 NOTE — ED Notes (Signed)
See triage note  Presents with sore throat  States he developed sore throat 3 days ago  Denies any fever at home   Low grade on arrival

## 2019-06-16 NOTE — ED Provider Notes (Signed)
[facility] Emergency Department Provider Note   ____________________________________________   First MD Initiated Contact with Patient 06/16/19 1042     (approximate)  I have reviewed the triage vital signs and the nursing notes.   HISTORY  Chief Complaint Sore Throat   HPI Antonio Ortiz is a 28 y.o. male presents to the ED with complaint of sore throat that started 3 days ago.  Patient reports subjective fever and chills.  Patient is able to drink fluids but decreased appetite.  He denies any cough, congestion, decreased smell or taste, nausea, vomiting or diarrhea.  He denies any known Covid exposure.  He rates his pain as a 10/10.     History reviewed. No pertinent past medical history.  There are no active problems to display for this patient.   History reviewed. No pertinent surgical history.  Prior to Admission medications   Medication Sig Start Date End Date Taking? Authorizing Provider  amoxicillin-clavulanate (AUGMENTIN) 875-125 MG tablet Take 1 tablet by mouth 2 (two) times daily for 10 days. 06/16/19 06/26/19  Tommi Rumps, PA-C  HYDROcodone-acetaminophen (NORCO/VICODIN) 5-325 MG tablet Take 1 tablet by mouth every 6 (six) hours as needed for moderate pain. 06/16/19   Tommi Rumps, PA-C  predniSONE (DELTASONE) 10 MG tablet Take 6 tablets  today, on day 2 take 5 tablets, day 3 take 4 tablets, day 4 take 3 tablets, day 5 take  2 tablets and 1 tablet the last day 06/16/19   Tommi Rumps, PA-C    Allergies Patient has no known allergies.  No family history on file.  Social History Social History   Tobacco Use  . Smoking status: Current Every Day Smoker    Packs/day: 0.50    Types: Cigarettes  . Smokeless tobacco: Never Used  Substance Use Topics  . Alcohol use: Yes  . Drug use: No    Review of Systems Constitutional: Subjective fever/chills Eyes: No visual changes. ENT: Positive sore throat. Cardiovascular:  Denies chest pain. Respiratory: Denies shortness of breath.  Negative for cough. Gastrointestinal: No abdominal pain.  No nausea, no vomiting.  No diarrhea.  Musculoskeletal: Negative for body aches. Skin: Negative for rash. Neurological: Negative for headaches, focal weakness or numbness. ___________________________________________   PHYSICAL EXAM:  VITAL SIGNS: ED Triage Vitals  Enc Vitals Group     BP 06/16/19 1023 (!) 163/91     Pulse Rate 06/16/19 1023 98     Resp 06/16/19 1023 20     Temp 06/16/19 1023 99.1 F (37.3 C)     Temp Source 06/16/19 1023 Oral     SpO2 06/16/19 1023 99 %     Weight 06/16/19 1021 240 lb (108.9 kg)     Height 06/16/19 1021 5\' 6"  (1.676 m)     Head Circumference --      Peak Flow --      Pain Score 06/16/19 1020 10     Pain Loc --      Pain Edu? --      Excl. in GC? --    Constitutional: Alert and oriented. Well appearing and in no acute distress.  Patient is able to talk in complete sentences without any difficulty.  He continues to drink fluids and is able to swallow saliva without any difficulty. Eyes: Conjunctivae are normal.  Head: Atraumatic. Nose: No congestion/rhinnorhea.  TMs are dull bilaterally. Mouth/Throat: Mucous membranes are moist.  Oropharynx erythematous without exudate.  Uvula is still midline however there is  moderate erythema and edema noted more on the left than the right.  Suggesting an peritonsillar cellulitis. Neck: No stridor.   Hematological/Lymphatic/Immunilogical: No cervical lymphadenopathy. Cardiovascular: Normal rate, regular rhythm. Grossly normal heart sounds.  Good peripheral circulation. Respiratory: Normal respiratory effort.  No retractions. Lungs CTAB. Musculoskeletal: Moves upper and lower extremities without any difficulty. Neurologic:  Normal speech and language. No gross focal neurologic deficits are appreciated. No gait instability. Skin:  Skin is warm, dry and intact. No rash noted. Psychiatric: Mood  and affect are normal. Speech and behavior are normal.  ____________________________________________   LABS (all labs ordered are listed, but only abnormal results are displayed)  Labs Reviewed  GROUP A STREP BY PCR - Abnormal; Notable for the following components:      Result Value   Group A Strep by PCR DETECTED (*)    All other components within normal limits  CBC WITH DIFFERENTIAL/PLATELET - Abnormal; Notable for the following components:   WBC 14.6 (*)    Neutro Abs 10.6 (*)    Monocytes Absolute 1.3 (*)    Abs Immature Granulocytes 0.09 (*)    All other components within normal limits  BASIC METABOLIC PANEL - Abnormal; Notable for the following components:   Glucose, Bld 108 (*)    All other components within normal limits    PROCEDURES  Procedure(s) performed (including Critical Care):  Procedures  ____________________________________________   INITIAL IMPRESSION / ASSESSMENT AND PLAN / ED COURSE  As part of my medical decision making, I reviewed the following data within the electronic MEDICAL RECORD NUMBER Notes from prior ED visits and [place] Controlled Substance Database  28 year old male presents to the ED with complaint of sore throat for the last 3 days.  Patient has continued to drink fluids and eat.  He states that this morning his throat feels worse.  He continues to swallow his secretions without difficulty.  On exam it appears patient has a peritonsillar cellulitis.  Labs were reassuring and strep test was positive.  Patient was given Unasyn 3 g IV and Decadron 10 mg IV while in the ED.  He was discharged on Augmentin 875 twice daily for 10 days, prednisone 10 mg tablets starting with 60 mg and tapering down.  Norco if needed for pain.  He is to follow-up with Dr. Kathyrn Sheriff who is the ENT on call today.  Patient was encouraged to call today to make an appointment to be seen this week.  He also was given strict instructions to return to the ED if any worsening of his  symptoms, difficulty swallowing or inability to take his antibiotics.  Patient was noted to be drinking fluids prior to discharge and his room. ____________________________________________   FINAL CLINICAL IMPRESSION(S) / ED DIAGNOSES  Final diagnoses:  Peritonsillar cellulitis     ED Discharge Orders         Ordered    amoxicillin-clavulanate (AUGMENTIN) 875-125 MG tablet  2 times daily     06/16/19 1422    predniSONE (DELTASONE) 10 MG tablet     06/16/19 1422    HYDROcodone-acetaminophen (NORCO/VICODIN) 5-325 MG tablet  Every 6 hours PRN     06/16/19 1422           Note:  This document was prepared using Dragon voice recognition software and may include unintentional dictation errors.    Johnn Hai, PA-C 06/16/19 1510    Carrie Mew, MD 06/17/19 1351

## 2019-06-16 NOTE — Discharge Instructions (Addendum)
Call [facility] and make an appointment to be seen tomorrow or Wednesday.  Begin taking medication as directed.  Take pain medication only as needed.  This medication could cause drowsiness and increase your risk for falling.  Drink fluids frequently to stay hydrated.  Augmentin is twice a day for the next 10 days.  The prednisone is a taper over the next 6 days decreasing by 1 tablet each day. Return to the emergency department if any worsening of your symptoms, difficulty swallowing medication or high fever.

## 2020-03-23 ENCOUNTER — Other Ambulatory Visit: Payer: Self-pay

## 2020-03-23 ENCOUNTER — Emergency Department
Admission: EM | Admit: 2020-03-23 | Discharge: 2020-03-23 | Disposition: A | Discharge status: Home / Self Care | Payer: Self-pay | Attending: Emergency Medicine | Admitting: Emergency Medicine

## 2020-03-23 ENCOUNTER — Emergency Department: Payer: Self-pay

## 2020-03-23 ENCOUNTER — Encounter: Payer: Self-pay | Admitting: Emergency Medicine

## 2020-03-23 DIAGNOSIS — F17210 Nicotine dependence, cigarettes, uncomplicated: Secondary | ICD-10-CM | POA: Insufficient documentation

## 2020-03-23 DIAGNOSIS — M79674 Pain in right toe(s): Secondary | ICD-10-CM | POA: Insufficient documentation

## 2020-03-23 LAB — URIC ACID: Uric Acid, Serum: 6.7 mg/dL (ref 3.7–8.6)

## 2020-03-23 NOTE — ED Triage Notes (Addendum)
Patient presents to the ED with painful large toe on right foot x 1 month.  Patient states he thinks toe problem is from steel toed shoes and 12 hour shifts.  Patient walking with a slight limp.  Patient states, "I really need a doctor's note."

## 2020-03-23 NOTE — ED Notes (Signed)
See triage note  Presents with pain to right great toe  States pain started about 1 month ago  No injury  Min swelling noted

## 2020-03-23 NOTE — Discharge Instructions (Signed)
Please take ibuprofen, 600mg  every 6-8 hours as needed for pain. Work note given for today and tomorrow. Rest and stay off of the foot while out of work.

## 2020-03-23 NOTE — ED Provider Notes (Signed)
[facility] Emergency Department Provider Note ____________________________________________   First MD Initiated Contact with Patient 03/23/20 1259     (approximate)  I have reviewed the triage vital signs and the nursing notes.   HISTORY  Chief Complaint Toe Pain   HPI Antonio Ortiz is a 29 y.o. male who presents to the emergency room for evaluation of right great toe pain. He states the pain has been present for about 1 month and is worsening. The pain is worse when he is on his feet for a prolonged amount of time such as at work. He works at [employer], doing manual labor, and wears steel toed shoes. He initially thought the pain was due to the steel toed shoes, but the pain has persisted and there is now swelling when compared to the other side. He has no history of trauma or injury to this foot. Has not tried any treatment.      History reviewed. No pertinent past medical history.  There are no problems to display for this patient.   History reviewed. No pertinent surgical history.  Prior to Admission medications   Not on File    Allergies Patient has no known allergies.  No family history on file.  Social History Social History   Tobacco Use  . Smoking status: Current Every Day Smoker    Packs/day: 0.50    Types: Cigarettes  . Smokeless tobacco: Never Used  Substance Use Topics  . Alcohol use: Yes  . Drug use: No    Review of Systems Constitutional: No fever/chills Eyes: No visual changes. ENT: No sore throat. Cardiovascular: Denies chest pain. Respiratory: Denies shortness of breath. Gastrointestinal: No abdominal pain.  No nausea, no vomiting.  No diarrhea.  No constipation. Genitourinary: Negative for dysuria. Musculoskeletal: +right great toe and foot pain. Otherwise negative.  Skin: Negative for rash. Neurological: Negative for headaches, focal weakness or  numbness.  ____________________________________________   PHYSICAL EXAM:  VITAL SIGNS: Antonio Triage Vitals  Enc Vitals Group     BP 03/23/20 1249 140/78     Pulse Rate 03/23/20 1249 74     Resp 03/23/20 1249 18     Temp 03/23/20 1249 98.1 F (36.7 C)     Temp Source 03/23/20 1249 Oral     SpO2 03/23/20 1249 98 %     Weight 03/23/20 1250 234 lb (106.1 kg)     Height 03/23/20 1250 5\' 7"  (1.702 m)     Head Circumference --      Peak Flow --      Pain Score 03/23/20 1250 8     Pain Loc --      Pain Edu? --      Excl. in GC? --    Constitutional: Alert and oriented. Well appearing and in no acute distress. Eyes: Conjunctivae are normal. PERRL. EOMI. Head: Atraumatic. Nose: No congestion/rhinnorhea. Mouth/Throat: Mucous membranes are moist.  Oropharynx non-erythematous. Neck: No stridor.   Cardiovascular: Normal rate, regular rhythm. Respiratory: Normal respiratory effort.  No retractions.  Gastrointestinal: Soft and nontender. No distention. No abdominal bruits. No CVA tenderness. Musculoskeletal: +mild erythema and swelling at based of left great toe as compared to right. +decreased painful ROM Of right great toe. TTP 1st MTP and 1st ray of right foot Neurologic:  Normal speech and language. No gross focal neurologic deficits are appreciated. No gait instability. Skin:  Skin is warm, dry and intact. No rash noted. Psychiatric: Mood and affect are  normal. Speech and behavior are normal.  ____________________________________________   LABS (all labs ordered are listed, but only abnormal results are displayed)  Labs Reviewed  URIC ACID   ____________________________________________  EKG  ____________________________________________  RADIOLOGY  ED MD interpretation:    Official radiology report(s): DG Toe Great Right  Result Date: 03/23/2020 CLINICAL DATA:  Pain and swelling. EXAM: RIGHT GREAT TOE COMPARISON:  04/24/2005 right foot radiographs. FINDINGS: There is no  evidence of fracture or dislocation. There is no evidence of arthropathy or other focal bone abnormality. Soft tissues are unremarkable. IMPRESSION: No acute osseous abnormality. Electronically Signed   By: Stana Bunting M.D.   On: 03/23/2020 13:58    ____________________________________________   PROCEDURES  Procedure(s) performed (including Critical Care):  Procedures   ____________________________________________   INITIAL IMPRESSION / ASSESSMENT AND PLAN / Antonio COURSE  As part of my medical decision making, I reviewed the following data within the electronic MEDICAL RECORD NUMBER Notes from prior Antonio visits      Antonio Ortiz is a 29 yo male who presents for left great toe pain x 1 month. No trauma, pain worsening over time. Exam reveals mild warmth, erythema and swelling of right 1st MTP compared to left. Given exam and worsening, will check uric acid, XR of great toe for evidence of gout.   XR read as normal, uric acid WNL. At this time, patient is stable for discharge. Discussed that he should take ibuprofen 600 mg every 6-8 hours as needed for pain. Given a work note for today and tomorrow to give the toe a rest. Given contact information for [facility] to establish PCP should he need any further work up. Return precautions given.  Antonio Ortiz was evaluated in Emergency Department on 03/23/2020 for the symptoms described in the history of present illness. He was evaluated in the context of the global COVID-19 pandemic, which necessitated consideration that the patient might be at risk for infection with the SARS-CoV-2 virus that causes COVID-19. Institutional protocols and algorithms that pertain to the evaluation of patients at risk for COVID-19 are in a state of rapid change based on information released by regulatory bodies including the CDC and federal and state organizations. These policies and algorithms were followed during the patient's care in the [facility].        ____________________________________________   FINAL CLINICAL IMPRESSION(S) / Antonio DIAGNOSES  Final diagnoses:  Great toe pain, right     [facility]io Discharge Orders    None       Note:  This document was prepared using Dragon voice recognition software and may include unintentional dictation errors.    Lucy Chris, PA 03/23/20 1501    Arnaldo Natal, MD 03/23/20 [phone]

## 2020-09-03 ENCOUNTER — Telehealth: Payer: Self-pay | Admitting: Student

## 2020-09-03 ENCOUNTER — Emergency Department
Admission: EM | Admit: 2020-09-03 | Discharge: 2020-09-03 | Disposition: A | Discharge status: Home / Self Care | Payer: Self-pay | Attending: Emergency Medicine | Admitting: Emergency Medicine

## 2020-09-03 ENCOUNTER — Other Ambulatory Visit: Payer: Self-pay

## 2020-09-03 DIAGNOSIS — J029 Acute pharyngitis, unspecified: Secondary | ICD-10-CM | POA: Insufficient documentation

## 2020-09-03 DIAGNOSIS — Z20822 Contact with and (suspected) exposure to covid-19: Secondary | ICD-10-CM | POA: Insufficient documentation

## 2020-09-03 DIAGNOSIS — F17210 Nicotine dependence, cigarettes, uncomplicated: Secondary | ICD-10-CM | POA: Insufficient documentation

## 2020-09-03 LAB — GROUP A STREP BY PCR: Group A Strep by PCR: DETECTED — AB

## 2020-09-03 LAB — SARS CORONAVIRUS 2 (TAT 6-24 HRS): SARS Coronavirus 2: NEGATIVE

## 2020-09-03 MED ORDER — AMOXICILLIN 500 MG PO CAPS: 500.0000 mg | ORAL_CAPSULE | Freq: Two times a day (BID) | ORAL | 0 refills | Status: AC

## 2020-09-03 NOTE — ED Triage Notes (Signed)
Pt c/o sore throat for the past 5 days with some difficulty swallowing, pt is in NAD.

## 2020-09-03 NOTE — Telephone Encounter (Cosign Needed)
Patient notified of positive strep from ER visit. Amoxicillin sent to pharmacy. Patient instructed on how to use medication and to complete entire course unless directed by a healthcare provider to stop taking.

## 2020-09-03 NOTE — ED Provider Notes (Signed)
[facility] Emergency Department Provider Note  ____________________________________________   Event Date/Time   First MD Initiated Contact with Patient 09/03/20 1049     (approximate)  I have reviewed the triage vital signs and the nursing notes.   HISTORY  Chief Complaint Sore Throat  HPI Antonio Ortiz is a 30 y.o. male who presents to the emergency department today for evaluation of sore throat for the last 5 days.  He reports it began with feeling like his tonsils were swollen.  He treated with over-the-counter medicine for the first few days with improvement, however.  No longer helping.  He denies any high-pitched voice, denies fever, denies COVID exposures.  He is not vaccinated against COVID-19.  He denies any loss of taste or smell, denies cough, denies abdominal pain, nausea, vomiting or diarrhea.        History reviewed. No pertinent past medical history.  There are no problems to display for this patient.   History reviewed. No pertinent surgical history.  Prior to Admission medications   Medication Sig Start Date End Date Taking? Authorizing Provider  amoxicillin (AMOXIL) 500 MG capsule Take 1 capsule (500 mg total) by mouth 2 (two) times daily for 10 days. 09/03/20 09/13/20  Lucy Chris, PA    Allergies Patient has no known allergies.  No family history on file.  Social History Social History   Tobacco Use  . Smoking status: Current Every Day Smoker    Packs/day: 0.50    Types: Cigarettes  . Smokeless tobacco: Never Used  Substance Use Topics  . Alcohol use: Yes  . Drug use: No    Review of Systems Constitutional: No fever/chills Eyes: No visual changes. ENT: + sore throat. Cardiovascular: Denies chest pain. Respiratory: Denies shortness of breath. Gastrointestinal: No abdominal pain.  No nausea, no vomiting.  No diarrhea.  No constipation. Genitourinary: Negative for dysuria. Musculoskeletal: Negative for  back pain. Skin: Negative for rash. Neurological: Negative for headaches, focal weakness or numbness.  ____________________________________________   PHYSICAL EXAM:  VITAL SIGNS: ED Triage Vitals  Enc Vitals Group     BP 09/03/20 1031 137/83     Pulse Rate 09/03/20 1031 98     Resp 09/03/20 1031 18     Temp 09/03/20 1031 97.8 F (36.6 C)     Temp Source 09/03/20 1031 Oral     SpO2 09/03/20 1031 97 %     Weight 09/03/20 1032 234 lb (106.1 kg)     Height 09/03/20 1032 5\' 6"  (1.676 m)     Head Circumference --      Peak Flow --      Pain Score 09/03/20 1032 8     Pain Loc --      Pain Edu? --      Excl. in GC? --    Constitutional: Alert and oriented. Well appearing and in no acute distress. Eyes: Conjunctivae are normal. PERRL. EOMI. Head: Atraumatic. Nose: No congestion/rhinnorhea. Mouth/Throat: Mucous membranes are moist.  Tonsils are 2+ bilaterally with exudates present.  There is no swelling under the tongue or anterior neck swelling.  Neck:  There is mild bilateral anterior cervical lymphadenopathy.  Trachea midline. Cardiovascular: Normal rate, regular rhythm. Grossly normal heart sounds.  Good peripheral circulation. Respiratory: Normal respiratory effort.  No retractions. Lungs CTAB. Gastrointestinal: Soft and nontender. No distention. No abdominal bruits. No CVA tenderness. Musculoskeletal: No lower extremity tenderness nor edema.  No joint effusions. Neurologic:  Normal speech and language. No  gross focal neurologic deficits are appreciated. No gait instability. Skin:  Skin is warm, dry and intact. No rash noted. Psychiatric: Mood and affect are normal. Speech and behavior are normal.  ____________________________________________   LABS (all labs ordered are listed, but only abnormal results are displayed)  Labs Reviewed  GROUP A STREP BY PCR - Abnormal; Notable for the following components:      Result Value   Group A Strep by PCR DETECTED (*)    All other  components within normal limits  SARS CORONAVIRUS 2 (TAT 6-24 HRS)   ____________________________________________   INITIAL IMPRESSION / ASSESSMENT AND PLAN / ED COURSE  As part of my medical decision making, I reviewed the following data within the electronic MEDICAL RECORD NUMBER Nursing notes reviewed and incorporated, Labs reviewed and Notes from prior ED visits        Patient is a 30 year old male who presents to the emergency department for evaluation of sore throat for the last 5 days.  See HPI for further details.  On physical exam, the patient does have an erythematous throat with tonsillar enlargement and exudates bilaterally.  He also has anterior cervical lymphadenopathy.  Otherwise, physical exam is grossly within normal limits.  Patient does not meet [facility] criteria for treatment without strep testing.  Will obtain strep swab as well as COVID swab given his symptoms.  If patient is positive for strep, we will call him and prescribe antibiotics.  Patient is amenable with this plan.  He is stable For outpatient therapy.     *Was notified after discharge of patient's positive strep result.  Amoxicillin was called into the patient's pharmacy and he was notified.  Patient amenable with this plan. ____________________________________________   FINAL CLINICAL IMPRESSION(S) / ED DIAGNOSES  Final diagnoses:  Pharyngitis, unspecified etiology  Suspected COVID-19 virus infection     ED Discharge Orders    None      *Please note:  Antonio Ortiz was evaluated in Emergency Department on 09/03/2020 for the symptoms described in the history of present illness. He was evaluated in the context of the global COVID-19 pandemic, which necessitated consideration that the patient might be at risk for infection with the SARS-CoV-2 virus that causes COVID-19. Institutional protocols and algorithms that pertain to the evaluation of patients at risk for COVID-19 are in a state of rapid change  based on information released by regulatory bodies including the CDC and federal and state organizations. These policies and algorithms were followed during the patient's care in the ED.  Some ED evaluations and interventions may be delayed as a result of limited staffing during and the pandemic.*   Note:  This document was prepared using Dragon voice recognition software and may include unintentional dictation errors.   Lucy Chris, PA 09/03/20 1534    Dionne Bucy, MD 09/03/20 1550

## 2020-11-12 ENCOUNTER — Other Ambulatory Visit: Payer: Self-pay

## 2020-11-12 ENCOUNTER — Emergency Department
Admission: EM | Admit: 2020-11-12 | Discharge: 2020-11-13 | Disposition: A | Discharge status: Home / Self Care | Payer: Self-pay | Attending: Emergency Medicine | Admitting: Emergency Medicine

## 2020-11-12 DIAGNOSIS — Z5321 Procedure and treatment not carried out due to patient leaving prior to being seen by health care provider: Secondary | ICD-10-CM | POA: Insufficient documentation

## 2020-11-12 DIAGNOSIS — R112 Nausea with vomiting, unspecified: Secondary | ICD-10-CM | POA: Insufficient documentation

## 2020-11-12 DIAGNOSIS — R197 Diarrhea, unspecified: Secondary | ICD-10-CM | POA: Insufficient documentation

## 2020-11-12 DIAGNOSIS — R109 Unspecified abdominal pain: Secondary | ICD-10-CM | POA: Insufficient documentation

## 2020-11-12 MED ORDER — ONDANSETRON 4 MG PO TBDP
4.0000 mg | ORAL_TABLET | Freq: Once | ORAL | Status: AC | PRN
  Administered 2020-11-12: 4 mg via ORAL
  Filled 2020-11-12: qty 1

## 2020-11-12 NOTE — ED Triage Notes (Signed)
Pt presents to ER with c/o n/v/d and stomach cramping x2 days.  Pt states his stools have been loose and he has been unable to keep anything down for last 2 days.  Pt A&O x4 and ambulatory in triage.

## 2020-11-12 NOTE — ED Notes (Signed)
Pt denies bloodwork at this time.  Pt explained why bloodwork is necessary but states he does not want any blood drawn.

## 2020-11-13 NOTE — ED Notes (Signed)
Pt name called, no answer in lobby or outside

## 2020-11-13 NOTE — ED Notes (Addendum)
Pt name called in person, no answer. This RN called pts phone number on file, no answer. Pt not visualized in lobby or directly outside lobby

## 2020-11-13 NOTE — ED Notes (Signed)
Pt phone called, pt answered and stated he had an emergency and had to leave. Pt is no longer here.

## 2020-12-25 IMAGING — DX DG TOE GREAT 2+V*R*
2 series · 2 of 2 positions shown · non-contrast
Comparison: 04/24/2005 right foot radiographs.

CLINICAL DATA: Pain and swelling.

EXAM:
RIGHT GREAT TOE

[toe obl]
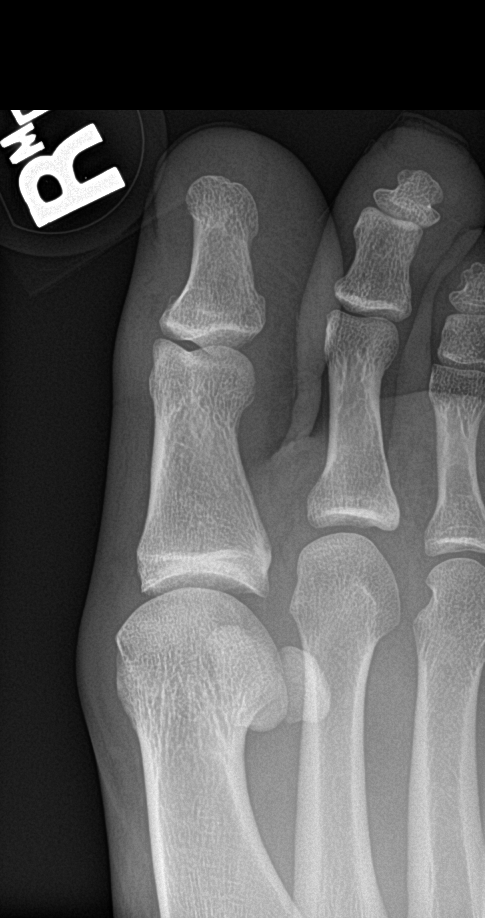

[toe lat]
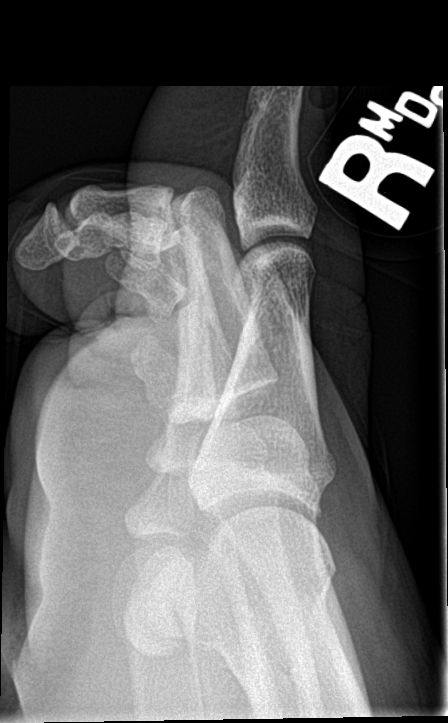

[2 of 2 positions shown; findings below may reference images not displayed]

FINDINGS: There is no evidence of fracture or dislocation. There is no
evidence of arthropathy or other focal bone abnormality. Soft
tissues are unremarkable.
IMPRESSION: No acute osseous abnormality.

## 2023-02-13 ENCOUNTER — Other Ambulatory Visit: Payer: Self-pay

## 2023-02-13 ENCOUNTER — Emergency Department
Admission: EM | Admit: 2023-02-13 | Discharge: 2023-02-13 | Disposition: A | Discharge status: Home / Self Care | Payer: Self-pay | Attending: Emergency Medicine | Admitting: Emergency Medicine

## 2023-02-13 DIAGNOSIS — G43809 Other migraine, not intractable, without status migrainosus: Secondary | ICD-10-CM | POA: Insufficient documentation

## 2023-02-13 DIAGNOSIS — R799 Abnormal finding of blood chemistry, unspecified: Secondary | ICD-10-CM | POA: Insufficient documentation

## 2023-02-13 DIAGNOSIS — R899 Unspecified abnormal finding in specimens from other organs, systems and tissues: Secondary | ICD-10-CM

## 2023-02-13 LAB — URINALYSIS, ROUTINE W REFLEX MICROSCOPIC
Bilirubin Urine: NEGATIVE
Glucose, UA: NEGATIVE mg/dL
Hgb urine dipstick: NEGATIVE
Ketones, ur: NEGATIVE mg/dL
Leukocytes,Ua: NEGATIVE
Nitrite: NEGATIVE
Protein, ur: NEGATIVE mg/dL
Specific Gravity, Urine: 1.015 (ref 1.005–1.030)
pH: 7 (ref 5.0–8.0)

## 2023-02-13 LAB — COMPREHENSIVE METABOLIC PANEL
ALT: 18 U/L (ref 0–44)
AST: 16 U/L (ref 15–41)
Albumin: 4.5 g/dL (ref 3.5–5.0)
Alkaline Phosphatase: 85 U/L (ref 38–126)
Anion gap: 8 (ref 5–15)
BUN: 14 mg/dL (ref 6–20)
CO2: 24 mmol/L (ref 22–32)
Calcium: 9.3 mg/dL (ref 8.9–10.3)
Chloride: 102 mmol/L (ref 98–111)
Creatinine, Ser: 0.88 mg/dL (ref 0.61–1.24)
GFR, Estimated: >60 mL/min (ref >60)
Glucose, Bld: 96 mg/dL (ref 70–99)
Potassium: 4 mmol/L (ref 3.5–5.1)
Sodium: 134 mmol/L — ABNORMAL LOW (ref 135–145)
Total Bilirubin: 0.7 mg/dL (ref 0.3–1.2)
Total Protein: 7.8 g/dL (ref 6.5–8.1)

## 2023-02-13 LAB — CBC
HCT: 42.3 % (ref 39.0–52.0)
Hemoglobin: 14.2 g/dL (ref 13.0–17.0)
MCH: 28.3 pg (ref 26.0–34.0)
MCHC: 33.6 g/dL (ref 30.0–36.0)
MCV: 84.3 fL (ref 80.0–100.0)
Platelets: 390 10*3/uL (ref 150–400)
RBC: 5.02 MIL/uL (ref 4.22–5.81)
RDW: 13.2 % (ref 11.5–15.5)
WBC: 6.3 10*3/uL (ref 4.0–10.5)
nRBC: 0 % (ref 0.0–0.2)

## 2023-02-13 LAB — LIPASE, BLOOD: Lipase: 52 U/L — ABNORMAL HIGH (ref 11–51)

## 2023-02-13 MED ORDER — IBUPROFEN 800 MG PO TABS: 800.0000 mg | ORAL_TABLET | Freq: Three times a day (TID) | ORAL | 0 refills | Status: AC | PRN

## 2023-02-13 MED ORDER — ONDANSETRON 4 MG PO TBDP
4.0000 mg | ORAL_TABLET | Freq: Once | ORAL | Status: AC
  Administered 2023-02-13: 4 mg via ORAL
  Filled 2023-02-13: qty 1

## 2023-02-13 MED ORDER — ONDANSETRON 4 MG PO TBDP: 4.0000 mg | ORAL_TABLET | Freq: Three times a day (TID) | ORAL | 0 refills | Status: AC | PRN

## 2023-02-13 MED ORDER — ACETAMINOPHEN 325 MG PO TABS
650.0000 mg | ORAL_TABLET | Freq: Once | ORAL | Status: AC
  Administered 2023-02-13: 650 mg via ORAL
  Filled 2023-02-13: qty 2

## 2023-02-13 NOTE — Discharge Instructions (Addendum)
Your exam and labs are normal and reassuring at this time.  You should consider follow-up with [facility] department for further testing including hepatitis panel.  In terms of your headache management.  Take OTC Tylenol and Benadryl with the prescription nausea medicine ibuprofen for headache management.  Continue to hydrate and rest as necessary.  Follow-up with one of the local primary providers listed below to establish a primary care home.  Please go to the following website to schedule new (and existing) patient appointments:   [email or web address]   The following is a list of primary care offices in the area who are accepting new patients at this time.  Please reach out to one of them directly and let them know you would like to schedule an appointment to follow up on an Emergency Department visit, and/or to establish a new primary care provider (PCP).  There are likely other primary care clinics in the are who are accepting new patients, but this is an excellent place to start:  [facility] Lead physician: Dr Shirlee Latch [address] [place], [place] [address] [phone]  [facility] Lead Physician: Dr Alba Cory [address], [place], [place] [address] [phone]  [facility]  Lead Physician: Dr Olevia Perches [address] [place], [place] [address] [phone]  [facility] Lead Physician: Dr Sofie Hartigan [address] [place], [place] [address] [phone]  [facility] Lead Physician: Dr Bari Edward [address] [place], [place], [place] [address] [phone]

## 2023-02-13 NOTE — ED Triage Notes (Signed)
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

## 2023-02-13 NOTE — ED Provider Notes (Signed)
[facility] Emergency Department Provider Note     Event Date/Time   First MD Initiated Contact with Patient 02/13/23 1607     (approximate)   History   Blood Infection   HPI  Antonio Ortiz is a 32 y.o. male presents to the ED for evaluation after recent lab draw. He admits to being a regular alcohol use. He was notified at the [facility] center that he had some lab abnormalities related to his liver enzymes or viral markers. He was not given a copy of the labs, and is not exactly sure what was out of range. He denies any abdominal pain, N/V, chest pain, or fevers. He does give a history of "migraine" headaches, unrelated to his concern for abnormal labs.   Physical Exam   Triage Vital Signs: ED Triage Vitals [02/13/23 1544]  Enc Vitals Group     BP (!) 147/88     Pulse Rate 91     Resp 18     Temp 97.9 F (36.6 C)     Temp Source Oral     SpO2 98 %     Weight 235 lb 0.2 oz (106.6 kg)     Height 5\' 6"  (1.676 m)     Head Circumference      Peak Flow      Pain Score 5     Pain Loc      Pain Edu?      Excl. in GC?     Most recent vital signs: Vitals:   02/13/23 1544 02/13/23 1701  BP: (!) 147/88 (!) 141/87  Pulse: 91 79  Resp: 18 18  Temp: 97.9 F (36.6 C) 98.1 F (36.7 C)  SpO2: 98% 100%    General Awake, no distress. NAD HEENT NCAT. PERRL. EOMI. Scleral are without icterus. No rhinorrhea. Mucous membranes are moist.  CV:  Good peripheral perfusion. RRR RESP:  Normal effort. CTA ABD:  No distention. Soft, nontender. Normal bowel sonds. No rebound, guarding, or rigidity. No organomegaly noted   ED Results / Procedures / Treatments   Labs (all labs ordered are listed, but only abnormal results are displayed) Labs Reviewed  LIPASE, BLOOD - Abnormal; Notable for the following components:      Result Value   Lipase 52 (*)    All other components within normal limits  COMPREHENSIVE METABOLIC PANEL - Abnormal; Notable for the  following components:   Sodium 134 (*)    All other components within normal limits  URINALYSIS, ROUTINE W REFLEX MICROSCOPIC - Abnormal; Notable for the following components:   Color, Urine STRAW (*)    APPearance CLEAR (*)    All other components within normal limits  CBC    EKG   RADIOLOGY   No results found.   PROCEDURES:  Critical Care performed: No  Procedures   MEDICATIONS ORDERED IN ED: Medications  ondansetron (ZOFRAN-ODT) disintegrating tablet 4 mg (4 mg Oral Given 02/13/23 1653)  acetaminophen (TYLENOL) tablet 650 mg (650 mg Oral Given 02/13/23 1653)     IMPRESSION / MDM / ASSESSMENT AND PLAN / ED COURSE  I reviewed the triage vital signs and the nursing notes.                              Differential diagnosis includes, but is not limited to, lab abnormality, alcoholic hepatitis, alcohol use disorder  Patient's presentation is most consistent with acute complicated illness /  injury requiring diagnostic workup.  Patient's diagnosis is consistent with concern for abnormal labs. He is found to have a normal exam and labs WNL. No evidence of LFT abnormality. He is without abdominal pain complaints. He will be treated for his acute head pain. Patient will be discharged home with prescriptions for IBU 800 and Zofran. Patient is to follow up with [facility] or a local PCP office, as discussed, as needed or otherwise directed. Patient is given ED precautions to return to the ED for any worsening or new symptoms.   FINAL CLINICAL IMPRESSION(S) / ED DIAGNOSES   Final diagnoses:  Other migraine without status migrainosus, not intractable  Abnormal laboratory test result     Rx / DC Orders   ED Discharge Orders          Ordered    ondansetron (ZOFRAN-ODT) 4 MG disintegrating tablet  Every 8 hours PRN        02/13/23 1643    ibuprofen (ADVIL) 800 MG tablet  Every 8 hours PRN        02/13/23 1643             Note:  This document was prepared using Dragon  voice recognition software and may include unintentional dictation errors.    Lissa Hoard, PA-C 02/13/23 1718    Jene Every, MD 02/13/23 [phone]

## 2023-02-13 NOTE — ED Triage Notes (Signed)
Pt here to be checked for Hep C. Pt went to the plasma center and was told that certain antibodies were found in his blood work and that his liver was swollen. Pt denies pain or any other symptoms.

## 2023-11-01 ENCOUNTER — Ambulatory Visit: Payer: Self-pay | Admitting: Nurse Practitioner

## 2023-11-01 ENCOUNTER — Encounter: Payer: Self-pay | Admitting: Nurse Practitioner

## 2023-11-01 DIAGNOSIS — Z113 Encounter for screening for infections with a predominantly sexual mode of transmission: Secondary | ICD-10-CM

## 2023-11-01 LAB — HEPATITIS B SURFACE ANTIGEN

## 2023-11-01 LAB — HM HIV SCREENING LAB: HM HIV Screening: NEGATIVE

## 2023-11-01 LAB — HM HEPATITIS C SCREENING LAB: HM Hepatitis Screen: POSITIVE

## 2023-11-01 NOTE — Progress Notes (Signed)
 [facility] Department STI clinic [address] [place] [address] Main phone: [phone]  STI screening visit  Subjective:  Antonio Ortiz is a 33 y.o. male being seen today for an STI screening visit. The patient reports they do not have symptoms.    Patient has the following medical conditions:  There are no active problems to display for this patient.   Chief Complaint  Patient presents with   SEXUALLY TRANSMITTED DISEASE    No symptoms   Patient is a pleasant 33 y.o. male who presents to the office today requesting asymptomatic STI testing. He reports 5 male partners in the last 2 months, and practices penile/vaginal penetrative sex and oral sex. Patient reports using condoms sometimes. Patient indicates a history of Chlamydia about 12 years ago. He reports last sex was yesterday.   STI screening history: Last HIV test per patient/review of record was No results found for: "HMHIVSCREEN" No results found for: "HIV"  Last HEPC test per patient/review of record was No results found for: "HMHEPCSCREEN" No components found for: "HEPC"   Last HEPB test per patient/review of record was No components found for: "HMHEPBSCREEN"   Fertility: Does the patient or their partner desires a pregnancy in the next year? No  Screening for MPX risk: Does the patient have an unexplained rash? No Is the patient MSM? No Does the patient endorse multiple sex partners or anonymous sex partners? Yes Did the patient have close or sexual contact with a person diagnosed with MPX? No Has the patient traveled outside the [place] where MPX is endemic? No Is there a high clinical suspicion for MPX-- evidenced by one of the following No  -Unlikely to be chickenpox  -Lymphadenopathy  -Rash that present in same phase of evolution on any given body part   See flowsheet for further details and programmatic requirements.    There is no immunization history on file  for this patient.   The following portions of the patient's history were reviewed and updated as appropriate: allergies, current medications, past medical history, past social history, past surgical history and problem list.  Objective:  There were no vitals filed for this visit.  Physical Exam Nursing note reviewed.  Constitutional:      Appearance: Normal appearance.  HENT:     Head: Normocephalic.     Salivary Glands: Right salivary gland is not diffusely enlarged or tender. Left salivary gland is not diffusely enlarged or tender.     Mouth/Throat:     Lips: Pink. No lesions.     Mouth: Mucous membranes are moist. No oral lesions.     Tongue: No lesions. Tongue does not deviate from midline.     Pharynx: Oropharynx is clear. Uvula midline. No oropharyngeal exudate or posterior oropharyngeal erythema.     Tonsils: No tonsillar exudate.  Eyes:     General:        Right eye: No discharge.        Left eye: No discharge.     Conjunctiva/sclera:     Right eye: Right conjunctiva is not injected. No exudate.    Left eye: Left conjunctiva is not injected. No exudate. Pulmonary:     Effort: Pulmonary effort is normal.  Genitourinary:    Comments: Patient asymptomatic and declines genital exam.  Lymphadenopathy:     Head:     Right side of head: No submental, submandibular, tonsillar, preauricular or posterior auricular adenopathy.     Left side of  head: No submental, submandibular, tonsillar, preauricular or posterior auricular adenopathy.     Cervical: No cervical adenopathy.     Right cervical: No superficial or posterior cervical adenopathy.    Left cervical: No superficial or posterior cervical adenopathy.     Upper Body:     Right upper body: No supraclavicular or axillary adenopathy.     Left upper body: No supraclavicular or axillary adenopathy.  Skin:    General: Skin is warm and dry.     Findings: No lesion or rash.     Comments: Skin tone appropriate for ethnicity.  Assessed exposed areas and back only.   Neurological:     Mental Status: He is alert and oriented to person, place, and time.  Psychiatric:        Attention and Perception: Attention and perception normal.        Mood and Affect: Mood and affect normal.        Speech: Speech normal.        Behavior: Behavior normal. Behavior is cooperative.        Thought Content: Thought content normal.     Assessment and Plan:  Antonio Ortiz is a 33 y.o. male presenting to the [facility] Department for STI screening  1. Screening for venereal disease (Primary)  - Gonococcus culture - HBV Antigen/Antibody State Lab - HIV/HCV [facility] - Syphilis Serology, [facility] - Chlamydia/GC NAA, Confirmation   Patient does not have STI symptoms Patient accepted all screenings including  urine GC/Chlamydia, and blood work for HIV/Syphilis. Patient meets criteria for HepB screening? Yes. Ordered? yes Patient meets criteria for HepC screening? Yes. Ordered? yes Recommended condom use with all sex Discussed importance of condom use for STI prevention  Treat positive test results per standing order. Discussed time line for State Lab results and that patient will be called with positive results and encouraged patient to call if he had not heard in 2 weeks Recommended repeat testing in 3 months with positive results. Recommended returning for continued or worsening symptoms.   Return if symptoms worsen or fail to improve.  No future appointments.  Total time with patient 15 minutes.  Edmonia James, NP

## 2023-11-05 LAB — CHLAMYDIA/GC NAA, CONFIRMATION
Chlamydia trachomatis, NAA: NEGATIVE
Neisseria gonorrhoeae, NAA: NEGATIVE

## 2023-11-05 LAB — GONOCOCCUS CULTURE

## 2023-11-09 ENCOUNTER — Telehealth: Payer: Self-pay | Admitting: Family Medicine

## 2023-11-09 ENCOUNTER — Telehealth: Payer: Self-pay

## 2023-11-09 DIAGNOSIS — Z205 Contact with and (suspected) exposure to viral hepatitis: Secondary | ICD-10-CM | POA: Insufficient documentation

## 2023-11-09 NOTE — Telephone Encounter (Addendum)
 Attempted to call client to advise of lab test results from 11/01/23. HIV is negative. Hep C -shows prior exposure to Hep C with clearance of infection either from prior treatment or immune response. Unable to leave message, no voicemail setup.    Client returned call and explained results. Provided education/counseled. Client asked about other test results-advised him Hep B negative,HIV negative, CL/GC negative. His syphilis was still pending. Client requested to have results emailed to him,because he is unable to access mychart. Email [email or web address]. Advised will f/u on sending test results  via email.

## 2023-11-12 NOTE — Telephone Encounter (Signed)
 Per last encounter on 3/28 Kateri Plummer RN spoke with client and informed him of results.
